# Patient Record
Sex: Male | Born: 1960 | Race: White | Hispanic: No | Smoking: Current every day smoker
Health system: Southern US, Community
[De-identification: ages and names within clinical notes are randomized; demographics above are authoritative.]

## PROBLEM LIST (undated history)

## (undated) DIAGNOSIS — N200 Calculus of kidney: Secondary | ICD-10-CM

## (undated) DIAGNOSIS — F410 Panic disorder [episodic paroxysmal anxiety] without agoraphobia: Secondary | ICD-10-CM

## (undated) HISTORY — PX: CHOLECYSTECTOMY: SHX55

---

## 2010-05-31 ENCOUNTER — Emergency Department (HOSPITAL_COMMUNITY)
Admission: EM | Admit: 2010-05-31 | Discharge: 2010-05-31 | Payer: Self-pay | Source: Home / Self Care | Admitting: Emergency Medicine

## 2012-08-15 ENCOUNTER — Emergency Department (HOSPITAL_BASED_OUTPATIENT_CLINIC_OR_DEPARTMENT_OTHER)
Admission: EM | Admit: 2012-08-15 | Discharge: 2012-08-15 | Disposition: A | Discharge status: Home / Self Care | Payer: Self-pay | Attending: Emergency Medicine | Admitting: Emergency Medicine

## 2012-08-15 ENCOUNTER — Encounter (HOSPITAL_BASED_OUTPATIENT_CLINIC_OR_DEPARTMENT_OTHER): Payer: Self-pay | Admitting: *Deleted

## 2012-08-15 ENCOUNTER — Emergency Department (HOSPITAL_BASED_OUTPATIENT_CLINIC_OR_DEPARTMENT_OTHER): Payer: Self-pay

## 2012-08-15 DIAGNOSIS — Z9089 Acquired absence of other organs: Secondary | ICD-10-CM | POA: Insufficient documentation

## 2012-08-15 DIAGNOSIS — N23 Unspecified renal colic: Secondary | ICD-10-CM | POA: Insufficient documentation

## 2012-08-15 DIAGNOSIS — F17200 Nicotine dependence, unspecified, uncomplicated: Secondary | ICD-10-CM | POA: Insufficient documentation

## 2012-08-15 LAB — URINE MICROSCOPIC-ADD ON

## 2012-08-15 LAB — URINALYSIS, ROUTINE W REFLEX MICROSCOPIC
Glucose, UA: NEGATIVE mg/dL
Ketones, ur: 15 mg/dL — AB
pH: 6.5 (ref 5.0–8.0)

## 2012-08-15 MED ORDER — OXYCODONE-ACETAMINOPHEN 5-325 MG PO TABS: 1.0000 | ORAL_TABLET | Freq: Four times a day (QID) | ORAL | Status: AC | PRN

## 2012-08-15 MED ORDER — KETOROLAC TROMETHAMINE 30 MG/ML IJ SOLN
30.0000 mg | Freq: Once | INTRAMUSCULAR | Status: AC
  Administered 2012-08-15: 30 mg via INTRAVENOUS
  Filled 2012-08-15: qty 1

## 2012-08-15 NOTE — ED Notes (Signed)
Seen at [facility]  last week had a ct scan and  Found 3mm kidney stone gave him meds and sent home got better until this morning at 0400 he woke up with severe flank pain again with nausea

## 2012-08-15 NOTE — ED Notes (Signed)
Patient transported to Ultrasound 

## 2012-08-15 NOTE — ED Provider Notes (Signed)
History     CSN: [id number]  Arrival date & time 08/15/12  1023   First MD Initiated Contact with Patient 08/15/12 1111      Chief Complaint  Patient presents with  . Flank Pain    (Consider location/radiation/quality/duration/timing/severity/associated sxs/prior treatment) HPI Pt reports he was recently diagnosed with left sided ureteral stone at [facility], given pain medications and strainer. He has had improved pain, but has not passed the stone, that he is aware of. He reports during the night his pain returns, severe aching L flank, radiating to L abdomen and groin. Increased urinary frequency but no fever.   History reviewed. No pertinent past medical history.  Past Surgical History  Procedure Date  . Cholecystectomy     History reviewed. No pertinent family history.  History  Substance Use Topics  . Smoking status: Current Every Day Smoker  . Smokeless tobacco: Not on file  . Alcohol Use: No      Review of Systems All other systems reviewed and are negative except as noted in HPI.   Allergies  Review of patient's allergies indicates no known allergies.  Home Medications  No current outpatient prescriptions on file.  BP 145/85  Pulse 75  Temp 97.9 F (36.6 C) (Oral)  Resp 18  Ht 6' (1.829 m)  Wt 185 lb (83.915 kg)  BMI 25.09 kg/m2  SpO2 100%  Physical Exam  Nursing note and vitals reviewed. Constitutional: He is oriented to person, place, and time. He appears well-developed and well-nourished.  HENT:  Head: Normocephalic and atraumatic.  Eyes: EOM are normal. Pupils are equal, round, and reactive to light.  Neck: Normal range of motion. Neck supple.  Cardiovascular: Normal rate, normal heart sounds and intact distal pulses.   Pulmonary/Chest: Effort normal and breath sounds normal.  Abdominal: Bowel sounds are normal. He exhibits no distension. There is no tenderness.  Musculoskeletal: Normal range of motion. He exhibits no edema  and no tenderness.  Neurological: He is alert and oriented to person, place, and time. He has normal strength. No cranial nerve deficit or sensory deficit.  Skin: Skin is warm and dry. No rash noted.  Psychiatric: He has a normal mood and affect.    ED Course  Procedures (including critical care time)  Labs Reviewed  URINALYSIS, ROUTINE W REFLEX MICROSCOPIC - Abnormal; Notable for the following:    Color, Urine AMBER (*)  BIOCHEMICALS MAY BE AFFECTED BY COLOR   APPearance CLOUDY (*)     Specific Gravity, Urine 1.036 (*)     Hgb urine dipstick LARGE (*)     Bilirubin Urine SMALL (*)     Ketones, ur 15 (*)     Protein, ur 100 (*)     Leukocytes, UA TRACE (*)     All other components within normal limits  URINE MICROSCOPIC-ADD ON - Abnormal; Notable for the following:    Crystals CA OXALATE CRYSTALS (*)     All other components within normal limits   US Renal  08/15/2012  *RADIOLOGY REPORT*  Clinical Data: Left lower quadrant left flank pain, recent diagnosis of left kidney stone  RENAL/URINARY TRACT ULTRASOUND COMPLETE  Comparison:  None  Findings:  Right Kidney:  11.6 cm length.  Normal cortical thickness echogenicity.  No mass, hydronephrosis or shadowing calcification.  Left Kidney:  12.1 cm length.  Normal cortical thickness echogenicity.  Mild left hydronephrosis.  No definite mass or shadowing calcification.  Bladder:  Decompressed, inadequately evaluated.  IMPRESSION: Mild left  hydronephrosis. No definite renal calculi identified, though the left ureter is not visualized on this study.   Original Report Authenticated By: Ulyses Southward, M.D.      No diagnosis found.    MDM  CT report from [facility] from 12/28 showed a distal 3mm stone on the left. Will check [place] to eval for continued hydronephrosis. Pain meds given (Toradol due to driving and low likelihood of emergent lithotripsy).   12:35 PM Improved pain, [place] consistent with continued stone. Advised to continue flomax, pain meds as  needed and Urology followup.       Charles B. Bernette Mayers, MD 08/15/12 1235

## 2012-08-30 ENCOUNTER — Emergency Department (HOSPITAL_BASED_OUTPATIENT_CLINIC_OR_DEPARTMENT_OTHER)
Admission: EM | Admit: 2012-08-30 | Discharge: 2012-08-30 | Disposition: A | Discharge status: Home / Self Care | Payer: Self-pay | Attending: Emergency Medicine | Admitting: Emergency Medicine

## 2012-08-30 ENCOUNTER — Emergency Department (HOSPITAL_BASED_OUTPATIENT_CLINIC_OR_DEPARTMENT_OTHER): Payer: Self-pay

## 2012-08-30 ENCOUNTER — Encounter (HOSPITAL_BASED_OUTPATIENT_CLINIC_OR_DEPARTMENT_OTHER): Payer: Self-pay

## 2012-08-30 DIAGNOSIS — S060X0A Concussion without loss of consciousness, initial encounter: Secondary | ICD-10-CM | POA: Insufficient documentation

## 2012-08-30 DIAGNOSIS — S139XXA Sprain of joints and ligaments of unspecified parts of neck, initial encounter: Secondary | ICD-10-CM | POA: Insufficient documentation

## 2012-08-30 DIAGNOSIS — W010XXA Fall on same level from slipping, tripping and stumbling without subsequent striking against object, initial encounter: Secondary | ICD-10-CM | POA: Insufficient documentation

## 2012-08-30 DIAGNOSIS — Y92009 Unspecified place in unspecified non-institutional (private) residence as the place of occurrence of the external cause: Secondary | ICD-10-CM | POA: Insufficient documentation

## 2012-08-30 DIAGNOSIS — F17200 Nicotine dependence, unspecified, uncomplicated: Secondary | ICD-10-CM | POA: Insufficient documentation

## 2012-08-30 DIAGNOSIS — Y9329 Activity, other involving ice and snow: Secondary | ICD-10-CM | POA: Insufficient documentation

## 2012-08-30 DIAGNOSIS — S060X9A Concussion with loss of consciousness of unspecified duration, initial encounter: Secondary | ICD-10-CM

## 2012-08-30 DIAGNOSIS — Z87442 Personal history of urinary calculi: Secondary | ICD-10-CM | POA: Insufficient documentation

## 2012-08-30 DIAGNOSIS — S161XXA Strain of muscle, fascia and tendon at neck level, initial encounter: Secondary | ICD-10-CM

## 2012-08-30 HISTORY — DX: Calculus of kidney: N20.0

## 2012-08-30 MED ORDER — CYCLOBENZAPRINE HCL 5 MG PO TABS: 5.0000 mg | ORAL_TABLET | Freq: Three times a day (TID) | ORAL | Status: AC | PRN

## 2012-08-30 MED ORDER — NAPROXEN 500 MG PO TABS: 500.0000 mg | ORAL_TABLET | Freq: Two times a day (BID) | ORAL | Status: AC

## 2012-08-30 NOTE — ED Provider Notes (Signed)
History    CSN: [id number] Arrival date & time 08/30/12  1102 First MD Initiated Contact with Patient 08/30/12 1140      Chief Complaint  Patient presents with  . Head Injury    Patient is a 52 y.o. male presenting with head injury. The history is provided by the patient.  Head Injury  The incident occurred 2 days ago. He came to the ER via walk-in. The injury mechanism was a fall. There was no loss of consciousness. The volume of blood lost was minimal. The quality of the pain is described as sharp. The pain is moderate. The pain has been intermittent since the injury. Pertinent negatives include no numbness, no vomiting, no tinnitus and no weakness.  Pt also has pain in his neck.  The neck pain started today.  He feels sore in his neck muscles.  He was initially screened by EMS when he fell the other day.  Past Medical History  Diagnosis Date  . Kidney stone     Past Surgical History  Procedure Date  . Cholecystectomy     No family history on file.  History  Substance Use Topics  . Smoking status: Current Every Day Smoker  . Smokeless tobacco: Not on file  . Alcohol Use: No      Review of Systems  HENT: Negative for tinnitus.   Gastrointestinal: Negative for vomiting.  Neurological: Negative for weakness and numbness.  All other systems reviewed and are negative.    Allergies  Review of patient's allergies indicates no known allergies.  Home Medications   Current Outpatient Rx  Name  Route  Sig  Dispense  Refill  . IBUPROFEN 200 MG PO TABS   Oral   Take 200 mg by mouth every 6 (six) hours as needed.         . OXYCODONE-ACETAMINOPHEN 5-325 MG PO TABS   Oral   Take 1-2 tablets by mouth every 6 (six) hours as needed for pain.   20 tablet   0     BP 141/88  Pulse 60  Temp 97.8 F (36.6 C) (Oral)  Resp 20  Ht 6' (1.829 m)  Wt 185 lb (83.915 kg)  BMI 25.09 kg/m2  SpO2 100%  Physical Exam  Nursing note and vitals reviewed. Constitutional: He  appears well-developed and well-nourished. No distress.  HENT:  Head: Normocephalic.  Right Ear: External ear normal.  Left Ear: External ear normal.       Abrasion posterior forehead   Eyes: Conjunctivae normal are normal. Right eye exhibits no discharge. Left eye exhibits no discharge. No scleral icterus.  Neck: Neck supple. No tracheal deviation present.  Cardiovascular: Normal rate, regular rhythm and intact distal pulses.   Pulmonary/Chest: Effort normal and breath sounds normal. No stridor. No respiratory distress. He has no wheezes. He has no rales.  Abdominal: Soft. Bowel sounds are normal. He exhibits no distension. There is no tenderness. There is no rebound and no guarding.  Musculoskeletal: He exhibits no edema and no tenderness.       Cervical back: He exhibits tenderness. He exhibits no bony tenderness, no swelling, no edema and no deformity.       Thoracic back: Normal.       Lumbar back: Normal.       Small , healing laceration right hand (5 mm)  Neurological: He is alert. He has normal strength. No sensory deficit. Cranial nerve deficit:  no gross defecits noted. He exhibits normal muscle tone. He displays  no seizure activity. Coordination normal.  Skin: Skin is warm and dry. No rash noted.  Psychiatric: He has a normal mood and affect.    ED Course  Procedures (including critical care time)  Labs Reviewed - No data to display Ct Head Wo Contrast  08/30/2012  *RADIOLOGY REPORT*  Clinical Data:  Larey Seat.  Hit back of head.  CT HEAD WITHOUT CONTRAST CT CERVICAL SPINE WITHOUT CONTRAST  Technique:  Multidetector CT imaging of the head and cervical spine was performed following the standard protocol without intravenous contrast.  Multiplanar CT image reconstructions of the cervical spine were also generated.  Comparison:   None  CT HEAD  Findings: The ventricles are normal.  No extra-axial fluid collections are seen.  The brainstem and cerebellum are unremarkable.  No acute  intracranial findings such as infarction or hemorrhage.  No mass lesions.  The bony calvarium is intact.  Scattered sinus disease is noted.  IMPRESSION: No acute intracranial findings or skull fracture.  CT CERVICAL SPINE  Findings: The sagittal reformatted images demonstrate normal alignment of the cervical vertebral bodies.  Disc spaces and vertebral bodies are maintained.  No acute bony findings or abnormal prevertebral soft tissue swelling. Sclerotic lesion noted in the C3 vertebral body on the left is likely a benign bone island.  The facets are normally aligned.  No facet or laminar fractures are seen. No large disc protrusions.  The neural foramen are patent.  The skull base C1 and C1-C2 articulations are maintained.  The dens is normal.  There are scattered cervical lymph nodes.  The lung apices are clear.  IMPRESSION: Normal alignment and no acute bony findings. Normal alignment and no acute fracture.   Original Report Authenticated By: Rudie Meyer, M.D.    Ct Cervical Spine Wo Contrast  08/30/2012  *RADIOLOGY REPORT*  Clinical Data:  Larey Seat.  Hit back of head.  CT HEAD WITHOUT CONTRAST CT CERVICAL SPINE WITHOUT CONTRAST  Technique:  Multidetector CT imaging of the head and cervical spine was performed following the standard protocol without intravenous contrast.  Multiplanar CT image reconstructions of the cervical spine were also generated.  Comparison:   None  CT HEAD  Findings: The ventricles are normal.  No extra-axial fluid collections are seen.  The brainstem and cerebellum are unremarkable.  No acute intracranial findings such as infarction or hemorrhage.  No mass lesions.  The bony calvarium is intact.  Scattered sinus disease is noted.  IMPRESSION: No acute intracranial findings or skull fracture.  CT CERVICAL SPINE  Findings: The sagittal reformatted images demonstrate normal alignment of the cervical vertebral bodies.  Disc spaces and vertebral bodies are maintained.  No acute bony findings or  abnormal prevertebral soft tissue swelling. Sclerotic lesion noted in the C3 vertebral body on the left is likely a benign bone island.  The facets are normally aligned.  No facet or laminar fractures are seen. No large disc protrusions.  The neural foramen are patent.  The skull base C1 and C1-C2 articulations are maintained.  The dens is normal.  There are scattered cervical lymph nodes.  The lung apices are clear.  IMPRESSION: Normal alignment and no acute bony findings. Normal alignment and no acute fracture.   Original Report Authenticated By: Rudie Meyer, M.D.      MDM  Patient does not have evidence of serious injury.  His headache and neck pain is likely related to soft tissue injury.        Celene Kras, MD 08/30/12 1254

## 2012-08-30 NOTE — ED Notes (Signed)
Pt states he slipped/fell on ice 2 days ago-struck the back of head-states he is unsure if LOC "i don't remember but if i did it wasn't for long"- -pt did get self up and into his home-EMS came to the home-pt did not seek further medical tx at that time-c/o cont'd HA and neck pain

## 2013-06-16 ENCOUNTER — Encounter (HOSPITAL_BASED_OUTPATIENT_CLINIC_OR_DEPARTMENT_OTHER): Payer: Self-pay | Admitting: Emergency Medicine

## 2013-06-16 ENCOUNTER — Emergency Department (HOSPITAL_BASED_OUTPATIENT_CLINIC_OR_DEPARTMENT_OTHER)
Admission: EM | Admit: 2013-06-16 | Discharge: 2013-06-16 | Disposition: A | Discharge status: Home / Self Care | Payer: Self-pay | Attending: Emergency Medicine | Admitting: Emergency Medicine

## 2013-06-16 DIAGNOSIS — F17200 Nicotine dependence, unspecified, uncomplicated: Secondary | ICD-10-CM | POA: Insufficient documentation

## 2013-06-16 DIAGNOSIS — Z79899 Other long term (current) drug therapy: Secondary | ICD-10-CM | POA: Insufficient documentation

## 2013-06-16 DIAGNOSIS — Z87442 Personal history of urinary calculi: Secondary | ICD-10-CM | POA: Insufficient documentation

## 2013-06-16 DIAGNOSIS — H5710 Ocular pain, unspecified eye: Secondary | ICD-10-CM | POA: Insufficient documentation

## 2013-06-16 DIAGNOSIS — H5789 Other specified disorders of eye and adnexa: Secondary | ICD-10-CM | POA: Insufficient documentation

## 2013-06-16 DIAGNOSIS — H5712 Ocular pain, left eye: Secondary | ICD-10-CM

## 2013-06-16 MED ORDER — ERYTHROMYCIN 5 MG/GM OP OINT: TOPICAL_OINTMENT | OPHTHALMIC | Status: AC

## 2013-06-16 MED ORDER — TETRACAINE HCL 0.5 % OP SOLN
2.0000 [drp] | Freq: Once | OPHTHALMIC | Status: AC
  Administered 2013-06-16: 2 [drp] via OPHTHALMIC

## 2013-06-16 MED ORDER — FLUORESCEIN SODIUM 1 MG OP STRP
1.0000 | ORAL_STRIP | Freq: Once | OPHTHALMIC | Status: AC
  Administered 2013-06-16: 1 via OPHTHALMIC

## 2013-06-16 NOTE — ED Notes (Signed)
Left eye pain x 1 hour. Using bleach and pressure washer. Unsure if he scratched his eye or got a FB in it.

## 2013-06-16 NOTE — ED Provider Notes (Signed)
CSN: [id number]     Arrival date & time 06/16/13  1706 History  This chart was scribed for Junius Argyle, MD by Blanchard Kelch, ED Scribe. The patient was seen in room MH08/MH08. Patient's care was started at 5:54 PM.    Chief Complaint  Patient presents with  . Eye Injury    Patient is a 52 y.o. male presenting with eye injury. The history is provided by the patient. No language interpreter was used.  Eye Injury This is a new problem. The current episode started 1 to 2 hours ago. The problem occurs constantly. The problem has been gradually improving. Exacerbated by: blinking. Relieved by: washing out with water.    HPI Comments: Bill Townsend is a 52 y.o. 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 He states that he suddenly felt foreign body get into his left eye about two hours ago while he was actively washing the house. He states it felt like a small pebble was stuck in his left eye. He was wearing reading glasses but no other protective eye gear. He is complaining of itching eye pain onset after the accident and feels like there is something still in the eye. He tried washing it out with a water bottle right after the injury occurred with mild relief. The pain has improved since the injury occurred. It was worsened when he blinked.  He denies any vision changes. He has had eye injuries in the past where he has gotten bleach in his eye. He has a past surgical history of cholecystectomy. He has a past medical history of a kidney stone.   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 He denies wearing contacts.     Past Medical History  Diagnosis Date  . Kidney stone    Past Surgical History  Procedure Laterality Date  . Cholecystectomy     No family history on file. History  Substance Use Topics  . Smoking  status: Current Every Day Smoker  . Smokeless tobacco: Not on file  . Alcohol Use: No    Review of Systems  Constitutional: Negative for fever.  HENT: Negative for drooling.   Eyes: Positive for pain, redness and itching. Negative for visual disturbance.  Respiratory: Negative for cough.   Cardiovascular: Negative for leg swelling.  Gastrointestinal: Negative for vomiting.  Endocrine: Negative for polyuria.  Genitourinary: Negative for hematuria.  Musculoskeletal: Negative for gait problem.  Skin: Negative for rash.  Allergic/Immunologic: Negative for immunocompromised state.  Neurological: Negative for speech difficulty.  Hematological: Negative for adenopathy.  Psychiatric/Behavioral: Negative for confusion.  All other systems reviewed and are negative.    Allergies  Review of patient's allergies indicates no known allergies.  Home Medications   Current Outpatient Rx  Name  Route  Sig  Dispense  Refill  . Diazepam (VALIUM PO)   Oral   Take by mouth.         . cyclobenzaprine (FLEXERIL) 5 MG tablet   Oral   Take 1 tablet (5 mg total) by mouth 3 (three) times daily as needed for muscle spasms.   21 tablet   0   . naproxen (NAPROSYN) 500 MG tablet   Oral   Take 1 tablet (500 mg total) by mouth 2 (two) times daily with a meal. As needed for pain   20 tablet  0   . oxyCODONE-acetaminophen (PERCOCET/ROXICET) 5-325 MG per tablet   Oral   Take 1-2 tablets by mouth every 6 (six) hours as needed for pain.   20 tablet   0    Triage Vitals: BP 126/80  Pulse 88  Temp(Src) 98.8 F (37.1 C) (Oral)  Resp 20  Wt 185 lb (83.915 kg)  SpO2 99%  Physical Exam  Nursing note and vitals reviewed. Constitutional: He is oriented to person, place, and time. He appears well-developed and well-nourished. No distress.  HENT:  Head: Normocephalic and atraumatic.  Mouth/Throat: Oropharynx is clear and moist.  Eyes: EOM are normal. Pupils are equal, round, and reactive to  light. No foreign body present in the left eye. Left conjunctiva is injected.  No obvious abrasion upon staining of the left eye.   Normal pH of left eye upon testing with pH strip.   Left eye has mild to moderate conjunctival injection.  20/70 vision bilaterally, tested w/out pt's reading glasses.   Eye lid everted w/out evidence of foreign body.   Neck: Neck supple. No tracheal deviation present.  Cardiovascular: Normal rate.   Pulmonary/Chest: Effort normal. No respiratory distress.  Musculoskeletal: Normal range of motion.  Neurological: He is alert and oriented to person, place, and time.  Skin: Skin is warm and dry.  Psychiatric: He has a normal mood and affect. His behavior is normal.    ED Course  Procedures (including critical care time)  DIAGNOSTIC STUDIES: Oxygen Saturation is 99% on room air, normal by my interpretation.    COORDINATION OF CARE: 5:59 PM -Will order antibiotic ointment for left eye due to suspicion of small unseen abrasion to left eye. Patient verbalizes understanding and agrees with treatment plan.    Labs Review Labs Reviewed - No data to display Imaging Review No results found.  EKG Interpretation   None       MDM   1. Eye pain, left    3:56 PM 52 y.o. male who pw fb in eye sensation. Pt was pressure washing and believes a pebble hit him in the left eye suddenly. AFVSS here. No obvious abrasion w/ staining. Pt was using bleach water, denies getting it in his eye, but I will check pH anyway. Will tx empirically for abrasion as his sx c/w this. Will give tetracaine w/ specific instructions to throw away the bottle after 24 hrs, he understands.     I have discussed the diagnosis/risks/treatment options with the patient and believe the pt to be eligible for discharge home to follow-up with pcp as needed. We also discussed returning to the ED immediately if new or worsening sx occur. We discussed the sx which are most concerning (e.g.,  worsening pain, change in vision, eye drainage) that necessitate immediate return. Any new prescriptions provided to the patient are listed below.  Discharge Medication List as of 06/16/2013  6:17 PM    START taking these medications   Details  erythromycin ophthalmic ointment Place a 1/2 inch ribbon of ointment into the lower eyelid. Apply to affected eye every 6 hours daily for 7 days., Print          I personally performed the services described in this documentation, which was scribed in my presence. The recorded information has been reviewed and is accurate.    Junius Argyle, MD 06/17/13 1600

## 2013-08-08 ENCOUNTER — Encounter (HOSPITAL_BASED_OUTPATIENT_CLINIC_OR_DEPARTMENT_OTHER): Payer: Self-pay | Admitting: Emergency Medicine

## 2013-08-08 ENCOUNTER — Emergency Department (HOSPITAL_BASED_OUTPATIENT_CLINIC_OR_DEPARTMENT_OTHER)
Admission: EM | Admit: 2013-08-08 | Discharge: 2013-08-08 | Disposition: A | Discharge status: Home / Self Care | Payer: Self-pay | Attending: Emergency Medicine | Admitting: Emergency Medicine

## 2013-08-08 ENCOUNTER — Emergency Department (HOSPITAL_BASED_OUTPATIENT_CLINIC_OR_DEPARTMENT_OTHER): Payer: Self-pay

## 2013-08-08 DIAGNOSIS — Z87442 Personal history of urinary calculi: Secondary | ICD-10-CM | POA: Insufficient documentation

## 2013-08-08 DIAGNOSIS — F410 Panic disorder [episodic paroxysmal anxiety] without agoraphobia: Secondary | ICD-10-CM | POA: Insufficient documentation

## 2013-08-08 DIAGNOSIS — J111 Influenza due to unidentified influenza virus with other respiratory manifestations: Secondary | ICD-10-CM | POA: Insufficient documentation

## 2013-08-08 DIAGNOSIS — R69 Illness, unspecified: Secondary | ICD-10-CM

## 2013-08-08 DIAGNOSIS — M2550 Pain in unspecified joint: Secondary | ICD-10-CM | POA: Insufficient documentation

## 2013-08-08 DIAGNOSIS — F17200 Nicotine dependence, unspecified, uncomplicated: Secondary | ICD-10-CM | POA: Insufficient documentation

## 2013-08-08 HISTORY — DX: Panic disorder (episodic paroxysmal anxiety): F41.0

## 2013-08-08 MED ORDER — GUAIFENESIN-CODEINE 100-10 MG/5ML PO SOLN: 10.0000 mL | Freq: Four times a day (QID) | ORAL | Status: AC | PRN

## 2013-08-08 NOTE — ED Notes (Signed)
Cold and cough symptoms with body aches and productive cough for the last two days.

## 2013-08-08 NOTE — ED Provider Notes (Signed)
CSN: [id number]     Arrival date & time 08/08/13  1052 History   First MD Initiated Contact with Patient 08/08/13 1123     Chief Complaint  Patient presents with  . URI   (Consider location/radiation/quality/duration/timing/severity/associated sxs/prior Treatment) Patient is a 53 y.o. male presenting with URI. The history is provided by the patient.  URI Presenting symptoms: congestion, cough, fever and sore throat   Severity:  Moderate Onset quality:  Gradual Duration:  2 days Timing:  Constant Progression:  Worsening Chronicity:  New Relieved by:  Nothing Worsened by:  Nothing tried Ineffective treatments:  None tried Associated symptoms: arthralgias     Past Medical History  Diagnosis Date  . Kidney stone   . Panic anxiety syndrome    Past Surgical History  Procedure Laterality Date  . Cholecystectomy     No family history on file. History  Substance Use Topics  . Smoking status: Current Every Day Smoker  . Smokeless tobacco: Not on file  . Alcohol Use: No    Review of Systems  Constitutional: Positive for fever.  HENT: Positive for congestion and sore throat.   Respiratory: Positive for cough.   Musculoskeletal: Positive for arthralgias.  All other systems reviewed and are negative.    Allergies  Review of patient's allergies indicates no known allergies.  Home Medications   Current Outpatient Rx  Name  Route  Sig  Dispense  Refill  . cyclobenzaprine (FLEXERIL) 5 MG tablet   Oral   Take 1 tablet (5 mg total) by mouth 3 (three) times daily as needed for muscle spasms.   21 tablet   0   . Diazepam (VALIUM PO)   Oral   Take by mouth.         . erythromycin ophthalmic ointment      Place a 1/2 inch ribbon of ointment into the lower eyelid. Apply to affected eye every 6 hours daily for 7 days.   1 g   0   . naproxen (NAPROSYN) 500 MG tablet   Oral   Take 1 tablet (500 mg total) by mouth 2 (two) times daily with a meal. As needed for pain   20  tablet   0   . oxyCODONE-acetaminophen (PERCOCET/ROXICET) 5-325 MG per tablet   Oral   Take 1-2 tablets by mouth every 6 (six) hours as needed for pain.   20 tablet   0    BP 135/84  Temp(Src) 98.8 F (37.1 C) (Oral)  Resp 20  Ht 6' (1.829 m)  Wt 185 lb (83.915 kg)  BMI 25.08 kg/m2  SpO2 99% Physical Exam  Nursing note and vitals reviewed. Constitutional: He is oriented to person, place, and time. He appears well-developed and well-nourished. No distress.  HENT:  Head: Normocephalic and atraumatic.  Mouth/Throat: Oropharynx is clear and moist.  TMs are clear bilaterally.  Neck: Normal range of motion. Neck supple.  Cardiovascular: Normal rate, regular rhythm and normal heart sounds.   No murmur heard. Pulmonary/Chest: Effort normal and breath sounds normal. No respiratory distress. He has no wheezes.  Abdominal: Soft. Bowel sounds are normal. He exhibits no distension. There is no tenderness.  Musculoskeletal: Normal range of motion. He exhibits no edema.  Lymphadenopathy:    He has no cervical adenopathy.  Neurological: He is alert and oriented to person, place, and time.  Skin: Skin is warm and dry. He is not diaphoretic.    ED Course  Procedures (including critical care time) Labs Review Labs  Reviewed - No data to display Imaging Review No results found.    MDM  No diagnosis found. Patient is a 53 year old male who presents with influenza-like symptoms. Chest x-ray is negative for pneumonia. I suspect a viral etiology. He will be treated with cough syrup, ibuprofen, Motrin, plenty of fluids and when necessary followup with his symptoms worsen or change.    Geoffery Lyons, MD 08/08/13 [phone]

## 2013-08-08 NOTE — Discharge Instructions (Signed)
Robitussin with codeine as needed for cough.  Tylenol 1000 mg rotated with Motrin 600 mg every 4 hours as needed for fever.  Drink plenty of fluids get plenty of rest.  Return to the emergency department if you develop severe chest pain, difficulty breathing, or other new or concerning symptoms.   Influenza, Adult Influenza ("the flu") is a viral infection of the respiratory tract. It occurs more often in winter months because people spend more time in close contact with one another. Influenza can make you feel very sick. Influenza easily spreads from person to person (contagious). CAUSES  Influenza is caused by a virus that infects the respiratory tract. You can catch the virus by breathing in droplets from an infected person's cough or sneeze. You can also catch the virus by touching something that was recently contaminated with the virus and then touching your mouth, nose, or eyes. SYMPTOMS  Symptoms typically last 4 to 10 days and may include:  Fever.  Chills.  Headache, body aches, and muscle aches.  Sore throat.  Chest discomfort and cough.  Poor appetite.  Weakness or feeling tired.  Dizziness.  Nausea or vomiting. DIAGNOSIS  Diagnosis of influenza is often made based on your history and a physical exam. A nose or throat swab test can be done to confirm the diagnosis. RISKS AND COMPLICATIONS You may be at risk for a more severe case of influenza if you smoke cigarettes, have diabetes, have chronic heart disease (such as heart failure) or lung disease (such as asthma), or if you have a weakened immune system. Elderly people and pregnant women are also at risk for more serious infections. The most common complication of influenza is a lung infection (pneumonia). Sometimes, this complication can require emergency medical care and may be life-threatening. PREVENTION  An annual influenza vaccination (flu shot) is the best way to avoid getting influenza. An annual flu shot is  now routinely recommended for all adults in the [place] TREATMENT  In mild cases, influenza goes away on its own. Treatment is directed at relieving symptoms. For more severe cases, your caregiver may prescribe antiviral medicines to shorten the sickness. Antibiotic medicines are not effective, because the infection is caused by a virus, not by bacteria. HOME CARE INSTRUCTIONS  Only take over-the-counter or prescription medicines for pain, discomfort, or fever as directed by your caregiver.  Use a cool mist humidifier to make breathing easier.  Get plenty of rest until your temperature returns to normal. This usually takes 3 to 4 days.  Drink enough fluids to keep your urine clear or pale yellow.  Cover your mouth and nose when coughing or sneezing, and wash your hands well to avoid spreading the virus.  Stay home from work or school until your fever has been gone for at least 1 full day. SEEK MEDICAL CARE IF:   You have chest pain or a deep cough that worsens or produces more mucus.  You have nausea, vomiting, or diarrhea. SEEK IMMEDIATE MEDICAL CARE IF:   You have difficulty breathing, shortness of breath, or your skin or nails turn bluish.  You have severe neck pain or stiffness.  You have a severe headache, facial pain, or earache.  You have a worsening or recurring fever.  You have nausea or vomiting that cannot be controlled. MAKE SURE YOU:  Understand these instructions.  Will watch your condition.  Will get help right away if you are not doing well or get worse. Document Released: 07/21/2000 Document Revised: 01/23/2012  Document Reviewed: 10/23/2011 [facility] Patient Information 2014 [place], [place].

## 2014-05-11 ENCOUNTER — Encounter (HOSPITAL_BASED_OUTPATIENT_CLINIC_OR_DEPARTMENT_OTHER): Payer: Self-pay | Admitting: Emergency Medicine

## 2014-05-11 ENCOUNTER — Emergency Department (HOSPITAL_BASED_OUTPATIENT_CLINIC_OR_DEPARTMENT_OTHER)
Admission: EM | Admit: 2014-05-11 | Discharge: 2014-05-11 | Disposition: A | Discharge status: Home / Self Care | Payer: Self-pay | Attending: Emergency Medicine | Admitting: Emergency Medicine

## 2014-05-11 ENCOUNTER — Emergency Department (HOSPITAL_BASED_OUTPATIENT_CLINIC_OR_DEPARTMENT_OTHER): Payer: Self-pay

## 2014-05-11 DIAGNOSIS — F410 Panic disorder [episodic paroxysmal anxiety] without agoraphobia: Secondary | ICD-10-CM | POA: Insufficient documentation

## 2014-05-11 DIAGNOSIS — J029 Acute pharyngitis, unspecified: Secondary | ICD-10-CM | POA: Insufficient documentation

## 2014-05-11 DIAGNOSIS — Z87442 Personal history of urinary calculi: Secondary | ICD-10-CM | POA: Insufficient documentation

## 2014-05-11 DIAGNOSIS — Z720 Tobacco use: Secondary | ICD-10-CM | POA: Insufficient documentation

## 2014-05-11 DIAGNOSIS — Z79899 Other long term (current) drug therapy: Secondary | ICD-10-CM | POA: Insufficient documentation

## 2014-05-11 DIAGNOSIS — Z791 Long term (current) use of non-steroidal anti-inflammatories (NSAID): Secondary | ICD-10-CM | POA: Insufficient documentation

## 2014-05-11 LAB — RAPID STREP SCREEN (MED CTR MEBANE ONLY): Streptococcus, Group A Screen (Direct): NEGATIVE

## 2014-05-11 MED ORDER — IBUPROFEN 800 MG PO TABS: 800.0000 mg | ORAL_TABLET | Freq: Three times a day (TID) | ORAL | Status: AC

## 2014-05-11 MED ORDER — AMOXICILLIN 500 MG PO CAPS: 500.0000 mg | ORAL_CAPSULE | Freq: Three times a day (TID) | ORAL | Status: AC

## 2014-05-11 MED ORDER — HYDROCODONE-ACETAMINOPHEN 5-325 MG PO TABS: 2.0000 | ORAL_TABLET | ORAL | Status: AC | PRN

## 2014-05-11 NOTE — ED Provider Notes (Signed)
History/physical exam/procedure(s) were performed by non-physician practitioner and as supervising physician I was immediately available for consultation/collaboration. I have reviewed all notes and am in agreement with care and plan.   Hilario Quarryanielle S Modell Fendrick, MD 05/11/14 [phone]

## 2014-05-11 NOTE — ED Provider Notes (Signed)
CSN: [id number]     Arrival date & time 05/11/14  [id number] History   First MD Initiated Contact with Patient 05/11/14 [phone]     Chief Complaint  Patient presents with  . Fever     (Consider location/radiation/quality/duration/timing/severity/associated sxs/prior Treatment) Patient is a 53 y.o. male presenting with cough. The history is provided by the patient. No language interpreter was used.  Cough Cough characteristics:  Productive Sputum characteristics:  Nondescript Severity:  Moderate Onset quality:  Gradual Timing:  Constant Progression:  Worsening Chronicity:  New Smoker: no   Relieved by:  Nothing Worsened by:  Nothing tried Ineffective treatments:  None tried Associated symptoms: shortness of breath   Associated symptoms: no chest pain   Risk factors: no recent infection     Past Medical History  Diagnosis Date  . Kidney stone   . Panic anxiety syndrome    Past Surgical History  Procedure Laterality Date  . Cholecystectomy     No family history on file. History  Substance Use Topics  . Smoking status: Current Every Day Smoker  . Smokeless tobacco: Not on file  . Alcohol Use: No    Review of Systems  Respiratory: Positive for cough and shortness of breath.   Cardiovascular: Negative for chest pain.  All other systems reviewed and are negative.     Allergies  Review of patient's allergies indicates no known allergies.  Home Medications   Prior to Admission medications   Medication Sig Start Date End Date Taking? Authorizing Provider  cyclobenzaprine (FLEXERIL) 5 MG tablet Take 1 tablet (5 mg total) by mouth 3 (three) times daily as needed for muscle spasms. 08/30/12   Linwood DibblesJon Knapp, MD  Diazepam (VALIUM PO) Take by mouth.    Historical Provider, MD  erythromycin ophthalmic ointment Place a 1/2 inch ribbon of ointment into the lower eyelid. Apply to affected eye every 6 hours daily for 7 days. 06/16/13   Purvis SheffieldForrest Harrison, MD  guaiFENesin-codeine 100-10  MG/5ML syrup Take 10 mLs by mouth every 6 (six) hours as needed for cough. 08/08/13   Geoffery Lyonsouglas Delo, MD  naproxen (NAPROSYN) 500 MG tablet Take 1 tablet (500 mg total) by mouth 2 (two) times daily with a meal. As needed for pain 08/30/12   Linwood DibblesJon Knapp, MD  oxyCODONE-acetaminophen (PERCOCET/ROXICET) 5-325 MG per tablet Take 1-2 tablets by mouth every 6 (six) hours as needed for pain. 08/15/12   Charles B. Bernette MayersSheldon, MD   There were no vitals taken for this visit. Physical Exam  Nursing note and vitals reviewed. Constitutional: He appears well-developed and well-nourished.  HENT:  Head: Normocephalic.  Right Ear: External ear normal.  Left Ear: External ear normal.  Eyes: Conjunctivae are normal. Pupils are equal, round, and reactive to light.  Neck: Normal range of motion. Neck supple.  Cardiovascular: Normal rate.   Pulmonary/Chest: Effort normal and breath sounds normal.  rhonchi  Abdominal: Soft.  Musculoskeletal: Normal range of motion.  Neurological: He is alert.  Skin: Skin is warm.  Psychiatric: He has a normal mood and affect.    ED Course  Procedures (including critical care time) Labs Review Labs Reviewed - No data to display  Imaging Review Dg Chest 2 View  05/11/2014   CLINICAL DATA:  Cough, fever and congestion for a few days.  EXAM: CHEST  2 VIEW  COMPARISON:  08/08/2013 and CT cervical spine 08/30/2012.  FINDINGS: There is a slight impression on the right lateral aspect of the trachea, unchanged. Trachea is otherwise midline. Heart  size normal. Lungs are clear. No pleural fluid.  IMPRESSION: 1. No acute findings. 2. Slight impression on the right lateral aspect of the trachea corresponds to an enlarged right lobe of the thyroid, which contains low-attenuation lesions on 08/30/2012. Consider nonemergent thyroid ultrasound in further evaluation, as clinically indicated.   Electronically Signed   By: Leanna Battles M.D.   On: 05/11/2014 10:28     EKG Interpretation None       MDM  Results for orders placed during the hospital encounter of 05/11/14  RAPID STREP SCREEN      Result Value Ref Range   Streptococcus, Group A Screen (Direct) NEGATIVE  NEGATIVE   Dg Chest 2 View  05/11/2014   CLINICAL DATA:  Cough, fever and congestion for a few days.  EXAM: CHEST  2 VIEW  COMPARISON:  08/08/2013 and CT cervical spine 08/30/2012.  FINDINGS: There is a slight impression on the right lateral aspect of the trachea, unchanged. Trachea is otherwise midline. Heart size normal. Lungs are clear. No pleural fluid.  IMPRESSION: 1. No acute findings. 2. Slight impression on the right lateral aspect of the trachea corresponds to an enlarged right lobe of the thyroid, which contains low-attenuation lesions on 08/30/2012. Consider nonemergent thyroid ultrasound in further evaluation, as clinically indicated.   Electronically Signed   By: Leanna Battles M.D.   On: 05/11/2014 10:28      Final diagnoses:  Pharyngitis    Pt counseled on need for ultrasound of neck.   Pt given resource referral for evaluation and followup. Rx for amoxicillian, hydrocodone and ibuprofen    Elson Areas, [place] 05/11/14 1118

## 2014-05-11 NOTE — ED Notes (Signed)
rx x 3 given for ibuprofen, hydrocodone, amoxicillin- resource guide given for f/u

## 2014-05-11 NOTE — Discharge Instructions (Signed)
Pharyngitis °Pharyngitis is redness, pain, and swelling (inflammation) of your pharynx.  °CAUSES  °Pharyngitis is usually caused by infection. Most of the time, these infections are from viruses (viral) and are part of a cold. However, sometimes pharyngitis is caused by bacteria (bacterial). Pharyngitis can also be caused by allergies. Viral pharyngitis may be spread from person to person by coughing, sneezing, and personal items or utensils (cups, forks, spoons, toothbrushes). Bacterial pharyngitis may be spread from person to person by more intimate contact, such as kissing.  °SIGNS AND SYMPTOMS  °Symptoms of pharyngitis include:   °· Sore throat.   °· Tiredness (fatigue).   °· Low-grade fever.   °· Headache. °· Joint pain and muscle aches. °· Skin rashes. °· Swollen lymph nodes. °· Plaque-like film on throat or tonsils (often seen with bacterial pharyngitis). °DIAGNOSIS  °Your health care provider will ask you questions about your illness and your symptoms. Your medical history, along with a physical exam, is often all that is needed to diagnose pharyngitis. Sometimes, a rapid strep test is done. Other lab tests may also be done, depending on the suspected cause.  °TREATMENT  °Viral pharyngitis will usually get better in 3-4 days without the use of medicine. Bacterial pharyngitis is treated with medicines that kill germs (antibiotics).  °HOME CARE INSTRUCTIONS  °· Drink enough water and fluids to keep your urine clear or pale yellow.   °· Only take over-the-counter or prescription medicines as directed by your health care provider:   °¨ If you are prescribed antibiotics, make sure you finish them even if you start to feel better.   °¨ Do not take aspirin.   °· Get lots of rest.   °· Gargle with 8 oz of salt water (½ tsp of salt per 1 qt of water) as often as every 1-2 hours to soothe your throat.   °· Throat lozenges (if you are not at risk for choking) or sprays may be used to soothe your throat. °SEEK MEDICAL  CARE IF:  °· You have large, tender lumps in your neck. °· You have a rash. °· You cough up green, yellow-brown, or bloody spit. °SEEK IMMEDIATE MEDICAL CARE IF:  °· Your neck becomes stiff. °· You drool or are unable to swallow liquids. °· You vomit or are unable to keep medicines or liquids down. °· You have severe pain that does not go away with the use of recommended medicines. °· You have trouble breathing (not caused by a stuffy nose). °MAKE SURE YOU:  °· Understand these instructions. °· Will watch your condition. °· Will get help right away if you are not doing well or get worse. °Document Released: 07/24/2005 Document Revised: 05/14/2013 Document Reviewed: 03/31/2013 °ExitCare® Patient Information ©2015 [employer]. This information is not intended to replace advice given to you by your health care provider. Make sure you discuss any questions you have with your health care provider. ° ° °Emergency Department Resource Guide °1) Find a Doctor and Pay Out of Pocket °Although you won't have to find out who is covered by your insurance plan, it is a good idea to ask around and get recommendations. You will then need to call the office and see if the doctor you have chosen will accept you as a new patient and what types of options they offer for patients who are self-pay. Some doctors offer discounts or will set up payment plans for their patients who do not have insurance, but you will need to ask so you aren't surprised when you get to your appointment. ° °  2) Contact Your Local Health Department °Not all health departments have doctors that can see patients for sick visits, but many do, so it is worth a call to see if yours does. If you don't know where your local health department is, you can check in your phone book. The CDC also has a tool to help you locate your state's health department, and many state websites also have listings of all of their local health departments. ° °3) Find a Walk-in Clinic °If  your illness is not likely to be very severe or complicated, you may want to try a walk in clinic. These are popping up all over the country in pharmacies, drugstores, and shopping centers. They're usually staffed by nurse practitioners or physician assistants that have been trained to treat common illnesses and complaints. They're usually fairly quick and inexpensive. However, if you have serious medical issues or chronic medical problems, these are probably not your best option. ° °No Primary Care Doctor: °- Call [facility] at  832-8000 - they can help you locate a primary care doctor that  accepts your insurance, provides certain services, etc. °- Physician Referral Service- 1-800-533-3463 ° °Chronic Pain Problems: °Organization         Address  Phone   Notes  °[facility]  (336) 297-2271 Patients need to be referred by their primary care doctor.  ° °Medication Assistance: °Organization         Address  Phone   Notes  °[place] [facility] [address] °[place], [place] [address] (336) 641-8030 --Must be a resident of [place] °-- Must have NO insurance coverage whatsoever (no Medicaid/ Medicare, etc.) °-- The pt. MUST have a primary care doctor that directs their care regularly and follows them in the community °  °[facility]  (866) 331-1348   °[employer]  (888) 892-1162   ° °Agencies that provide inexpensive medical care: °Organization         Address  Phone   Notes  °[facility]  (336) 832-8035   ° Internal Medicine    (336) 832-7272   °[facility] [address] °[place], [place] [address] (336) 832-4777   °[facility] [address], °[place] (336) 271-4999   °[facility]    (336) 373-0678   °[facility]    (336) 272-1050   °[facility] ° [address], [place] Phone:  (336) 832-4444, Fax:  (336) 832-4440 Hours of  Operation:  9 am - 6 pm, M-F.  Also accepts Medicaid/Medicare and self-pay.  °[facility] ° [address], [place] Phone: (336) 832-3150, Fax: (336) [phone]. Hours of Operation:  8:30 am - 5:30 pm, M-F.  Also accepts Medicaid and self-pay.  °[facility] [place] [address], [place] Phone: (336) 878-6027   °[facility] [address], [place], [place] (336)723-1848, Ext. 123 Mondays & Thursdays: 7-9 AM.  First 15 patients are seen on a first come, first serve basis. °  ° °Medicaid-accepting [place] Providers: ° °Organization         Address  Phone   Notes  °[facility] [address], [place] (336) [phone] Also accepts self-pay patients.  °[facility] [address], [place] ° (336) 856-9996   °[facility] [address], [place] (336) 288-8857   °[facility] [address] [place] [address], [place] (336) 299-7000   °Veita Bland [address], [place]  ° (336) 373-1557 Only accepts [place] Access Medicaid patients after they have their name applied to their card.  ° °Self-Pay (no insurance) in [place]: ° °Organization         Address  Phone   Notes  °Sickle Cell Patients, [facility] [address], [place] (336) 832-1970   °[facility] [address], [place] (336) 832-4400   °[facility] ° [address], [place] (336) 992-4800   °[facility]/Dr. Osei-Bonsu ° [address] [place] [address], [place] or [address], [place] (336) 841-8500 Phone number for both [place] and [place] locations is the same.  °[facility] [address], [place] (336) 299-0000   °[facility] [address] [place] [address], [place] or [address] (336) 852-7530 °(336) 878-2260   °[facility] [address], [place] (336) 350-1642, phone; (336) 294-5005, fax Sees patients 1st and 3rd Saturday of every month.  Must not qualify for public or private insurance (i.e. Medicaid, Medicare, [place] Health Choice, Veterans' Benefits) • Household income should be no more than 200% of the poverty level •The clinic cannot treat you if you are pregnant or think you are pregnant • Sexually transmitted diseases are not treated at the clinic.  ° ° °Dental Care: °Organization         Address  Phone  Notes  °[facility] [address], [place] (336) 641-6152 Accepts children up to age 21 who are enrolled in Medicaid or [place] Health Choice; pregnant women with a Medicaid card; and children who have applied for Medicaid or [place] Health Choice, but were declined, whose parents can pay a reduced fee at time of service.  °[facility] [place]  [address], [place] (336) 641-7733 Accepts children up to age 21 who are enrolled in Medicaid or [place] Health Choice; pregnant women with a Medicaid card; and children who have applied for Medicaid or [place] Health Choice, but were declined, whose parents can pay a reduced fee at time of service.  °[facility] ° [address], [place] (336) 641-4533 Patients are seen by appointment only. Walk-ins are not accepted. [facility] will see patients 18 years of age and older. °Monday - Tuesday (8am-5pm) °Most Wednesdays (8:30-5pm) °$30 per visit, cash only  °[facility] ° [address], [place] (336) 641-4533 Patients are seen by appointment only. Walk-ins are not accepted. [facility] will see patients 18 years of age and older. °One Wednesday Evening (Monthly: Volunteer Based).  $30 per visit, cash only  °[facility]  (919) 537-3737 for adults; Children under age 4, call Graduate Pediatric  Dentistry at ([phone]) [phone]. Children aged 4-14, please call ([phone]) [phone] to request a pediatric application. ° Dental services are provided in all areas of dental care including fillings, crowns and bridges, complete and partial dentures, implants, gum treatment, root canals, and extractions. Preventive care is also provided. Treatment is provided to both adults and children. °Patients are selected via a lottery and there is often a waiting list. °  °[facility] [address], °  [place] ° (336) 763-8833 [email or web address] °  °[facility] [address], [place], [place] (336)723-1848, Ext. 123 Second and Fourth Thursday of each month, opens at 6:30 AM; Clinic ends at 9 AM.  Patients are seen on a first-come first-served basis, and a limited number are seen during each clinic.  ° °[facility] ° [address], [place], [place] (336) 723-7904   Eligibility Requirements °You must have lived in [place], [place], or [place] counties for at least the last three months. °  You cannot be eligible for state or federal sponsored healthcare insurance, including Veterans Administration, Medicaid, or Medicare. °  You generally cannot be eligible for healthcare insurance through your employer.  °  How to apply: °Eligibility screenings are held every Tuesday and Wednesday afternoon from 1:00 pm until 4:00 pm. You do not need an appointment for the interview!  °[facility] [address], [place], [place] 336-631-2330   °[facility]  336-342-8273   °[facility]  336-703-3100   °[facility]  336-570-6415   ° °Behavioral Health Resources in the Community: °Intensive Outpatient Programs °Organization         Address  Phone  Notes  °[facility] [address], [place], [place] 336-878-6098   °[facility] [address], [place], [place] 336-832-9800   °[facility] [address], [place], [place] ° 336-882-2125   °[facility] [address],  °[place], [place] 1-800-853-5163 or 336-641-4981   °Substance Abuse Resources °Organization         Address  Phone  Notes  °[facility]  336-882-2125   °[facility]  336-784-9470   °[facility]  336-285-9073   °[facility]  336-845-3988   °Residential & Outpatient Substance Abuse Program  1-800-659-3381   °Psychological Services °Organization         Address  Phone  Notes  °[facility]  336- 832-9600   °[facility]  336- 378-7881   °[facility] [address], [place] 1-800-853-5163 or 336-641-4981   ° °Mobile Crisis Teams °Organization         Address  Phone  Notes  °[facility], Mobile Crisis Care Unit  1-877-626-1772   °Assertive °[facility] ° [address] [place], [place] 336-834-9664   °Sharon DeEsch [address] °[place] 336-554-5454   ° °Self-Help/Support Groups °Organization         Address  Phone             Notes  °[facility] - variety of support groups  336- 373-1402 Call for more information  °Narcotics Anonymous (NA), [facility] [address], °[place]  2 meetings at this location  ° °Residential Treatment Programs °Organization         Address  Phone  Notes  °[facility] [address],    °[place]  1-866-801-8205   °[facility] ° [address], [place], [place] 704-293-8524   °[facility] [address], [place] 336-845-3988 Admissions: 8am-3pm M-F  °[facility] [address]    °[place], [place] 336-841-1104   °[facility] [address], [place], [place] 336-379-7146   °[facility] [address]  °[place], [place] 336-285-9073   °[facility] - Intensive Outpatient [address],  [place], [place] 336-852-3033   °[facility] [address]  °[place], [place] 1-877-615-2722 or 336-784-9470   °[facility] [address] [place], [place] 336-227-7417 Accepts Medicaid  °[facility] [address]  °[place] 1-800-659-3381 Substance Abuse/Addiction Treatment  ° °[place] Behavioral Health Resources °Organization         Address  Phone  Notes  °[facility]  (888) 581-9988   °Julie Brannon, PhD [address] [place], [place]   (336) 349-5553 or (336) 951-0000   °[facility]   [address] °[place], [place] (336) 349-4454   °[facility] [address], [place], [place] (336) 342-8316 Insurance/Medicaid/sponsorship through [facility]  °[facility] [address]                                    [place], [place] (336) 342-8316 Therapy/tele-psych/case  °[facility] [address]  ° [place], [place] (336) 349-2233    °Dr. Arfeen  (336) 349-4544   °[facility] [place]  [employer] [place] [facility] 1) [address], [place] °2) [address], [place] °3)  [address], [place] (336) 349-3220 °(336) 342-7768 ° °(336) 342-8140   °[place] [facility] (336) 342-1394 or (336) 342-3537 (After Hours)    ° ° ° ° °

## 2014-05-11 NOTE — ED Notes (Signed)
Pt reports fever x cough since yesterday, body aches, headache- took tylenol 1 hour pta

## 2014-05-13 LAB — CULTURE, GROUP A STREP

## 2014-10-07 IMAGING — CR DG CHEST 2V
2 series · 2 of 2 positions shown · non-contrast
Comparison: None

CLINICAL DATA: Cold and cough with body aches.

EXAM:
CHEST  2 VIEW

[w chest pa]
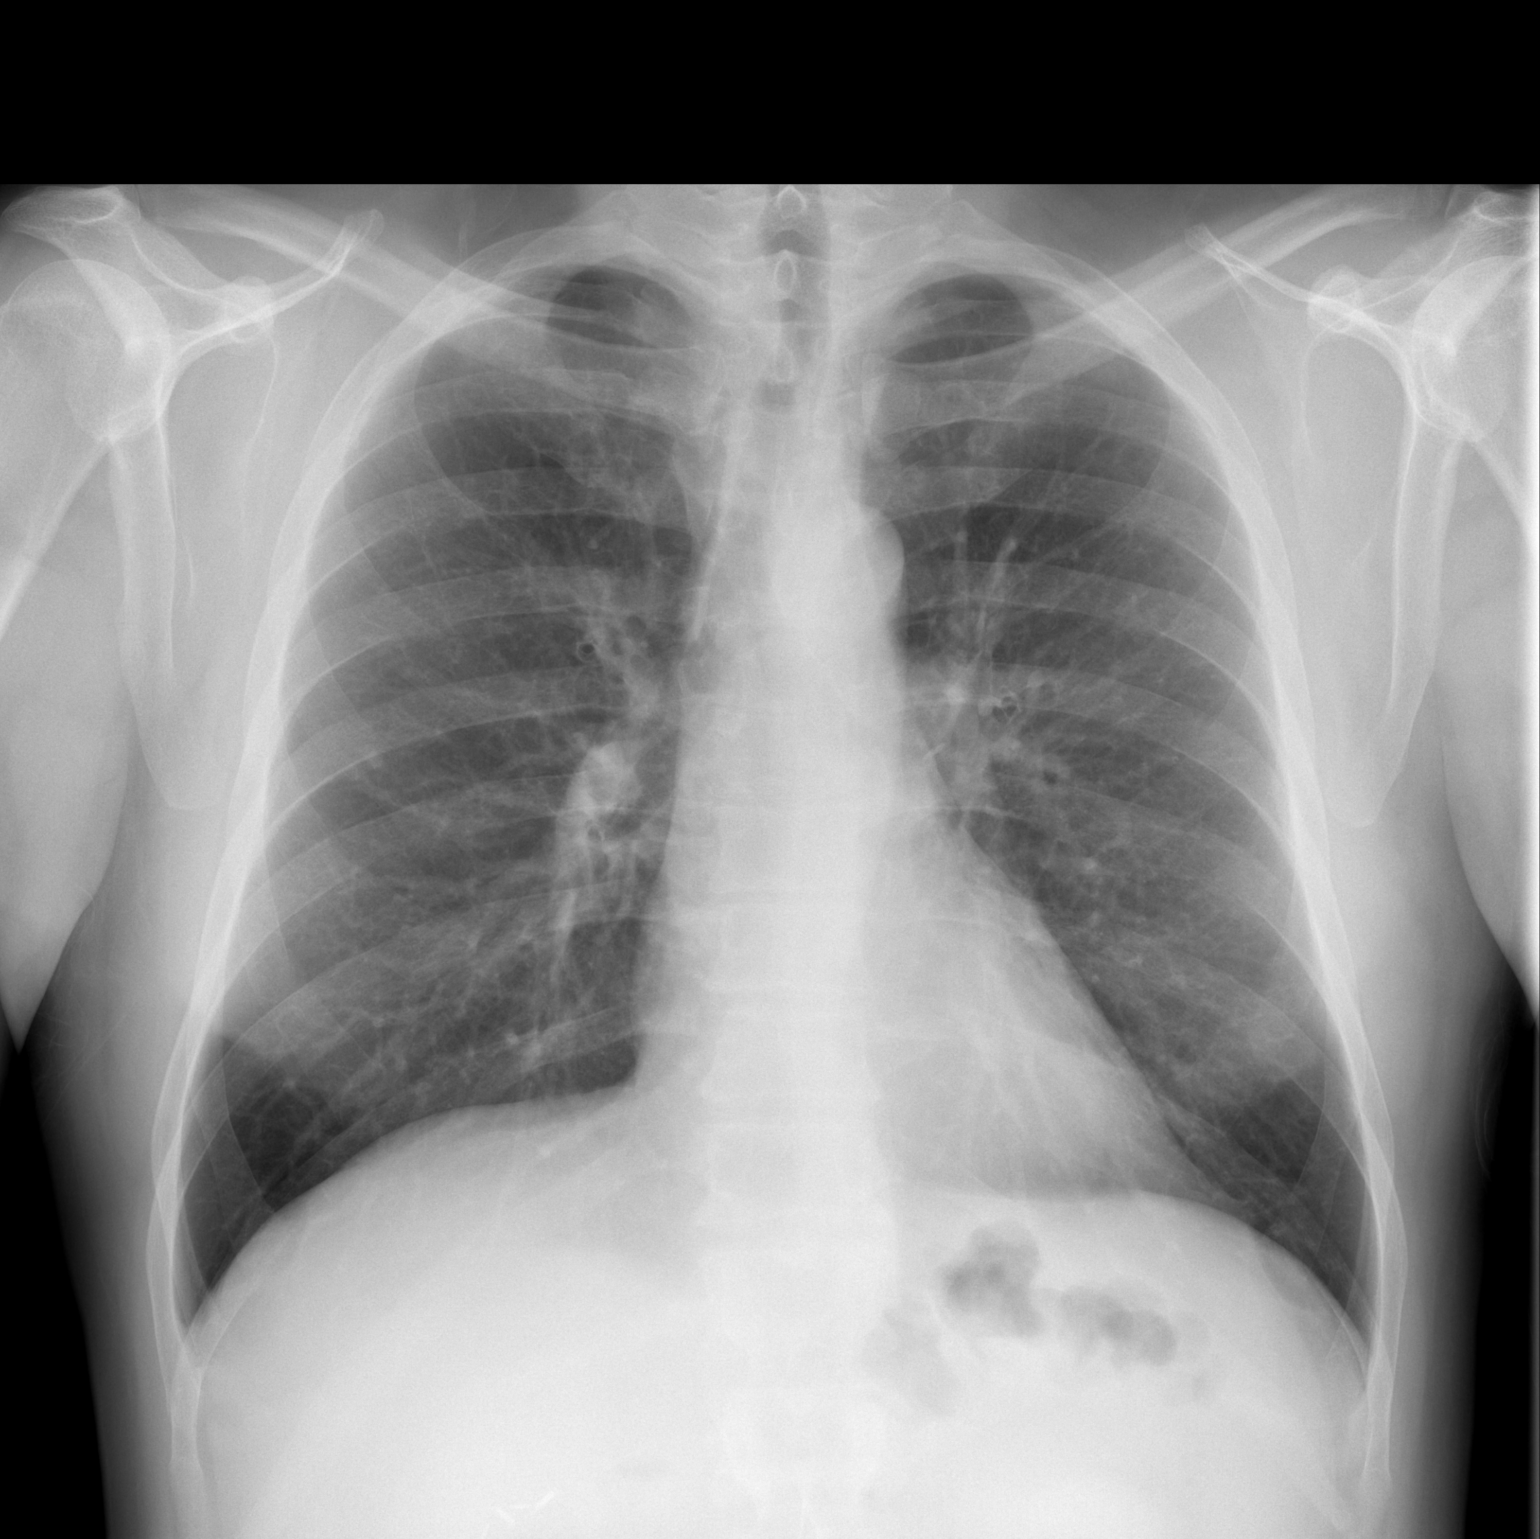

[w chest lat]
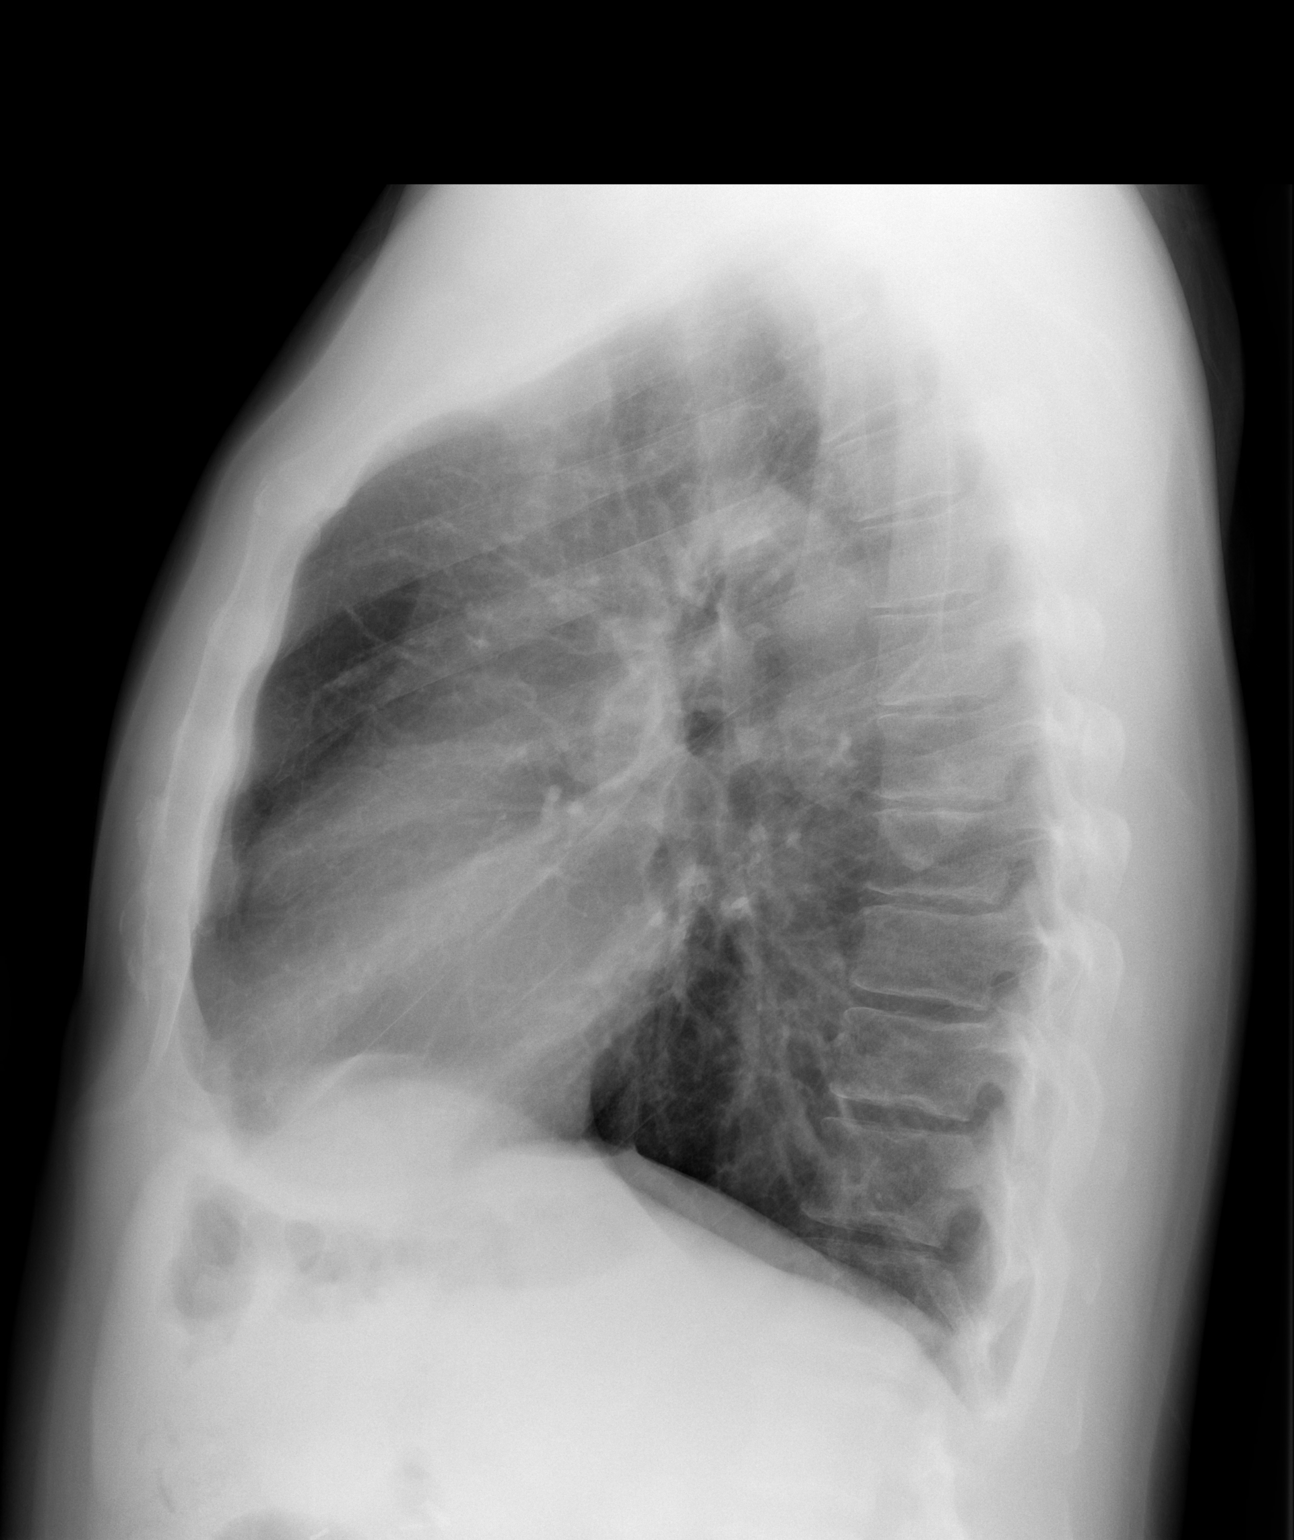

[2 of 2 positions shown; findings below may reference images not displayed]

FINDINGS: The heart size and mediastinal contours are within normal limits.
Both lungs are clear. The visualized skeletal structures are
unremarkable.
IMPRESSION: No active cardiopulmonary disease.

## 2015-07-10 IMAGING — CR DG CHEST 2V
2 series · 2 of 2 positions shown · non-contrast
Comparison: 08/08/[address] cervical spine 08/30/2012.

CLINICAL DATA: Cough, fever and congestion for a few days.

EXAM:
CHEST  2 VIEW

[w chest pa]
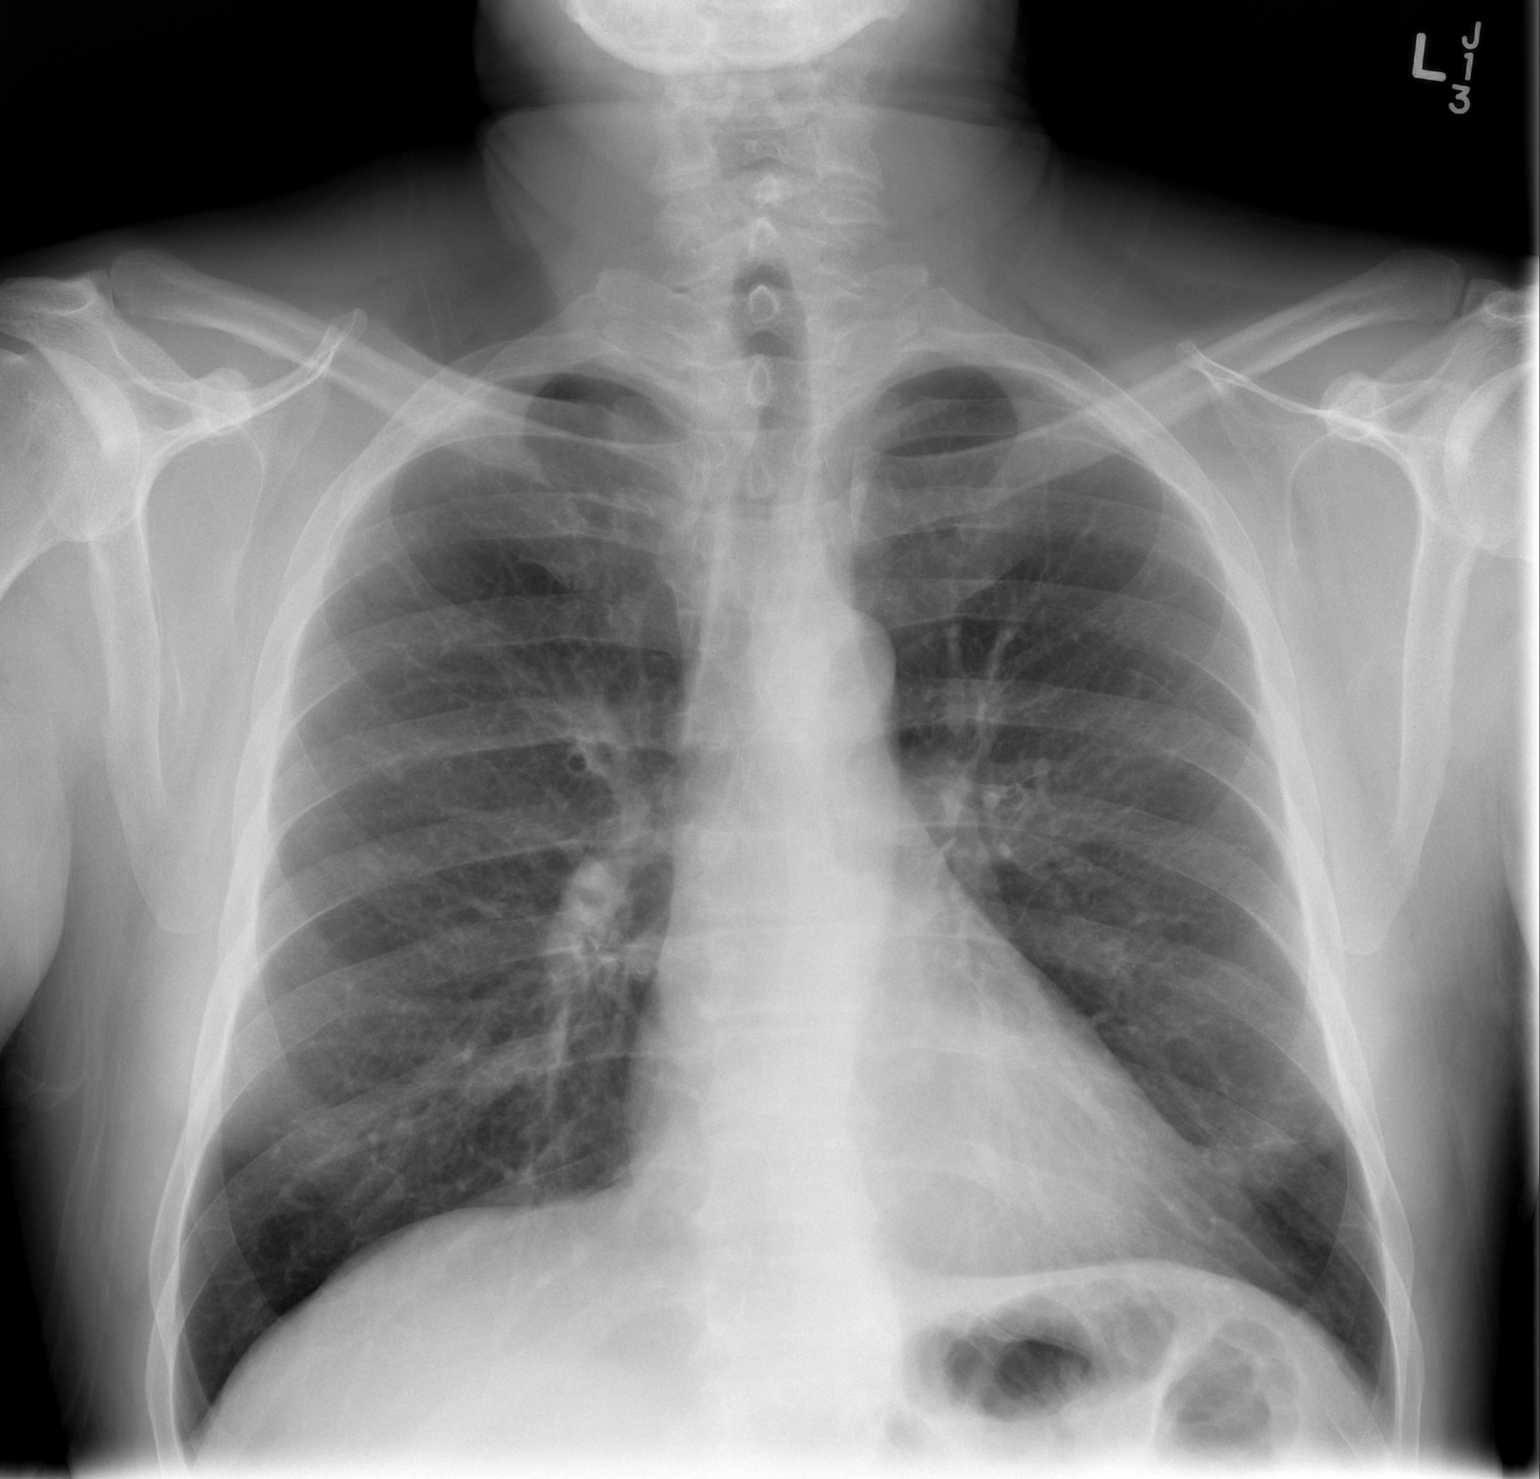

[w chest lat]
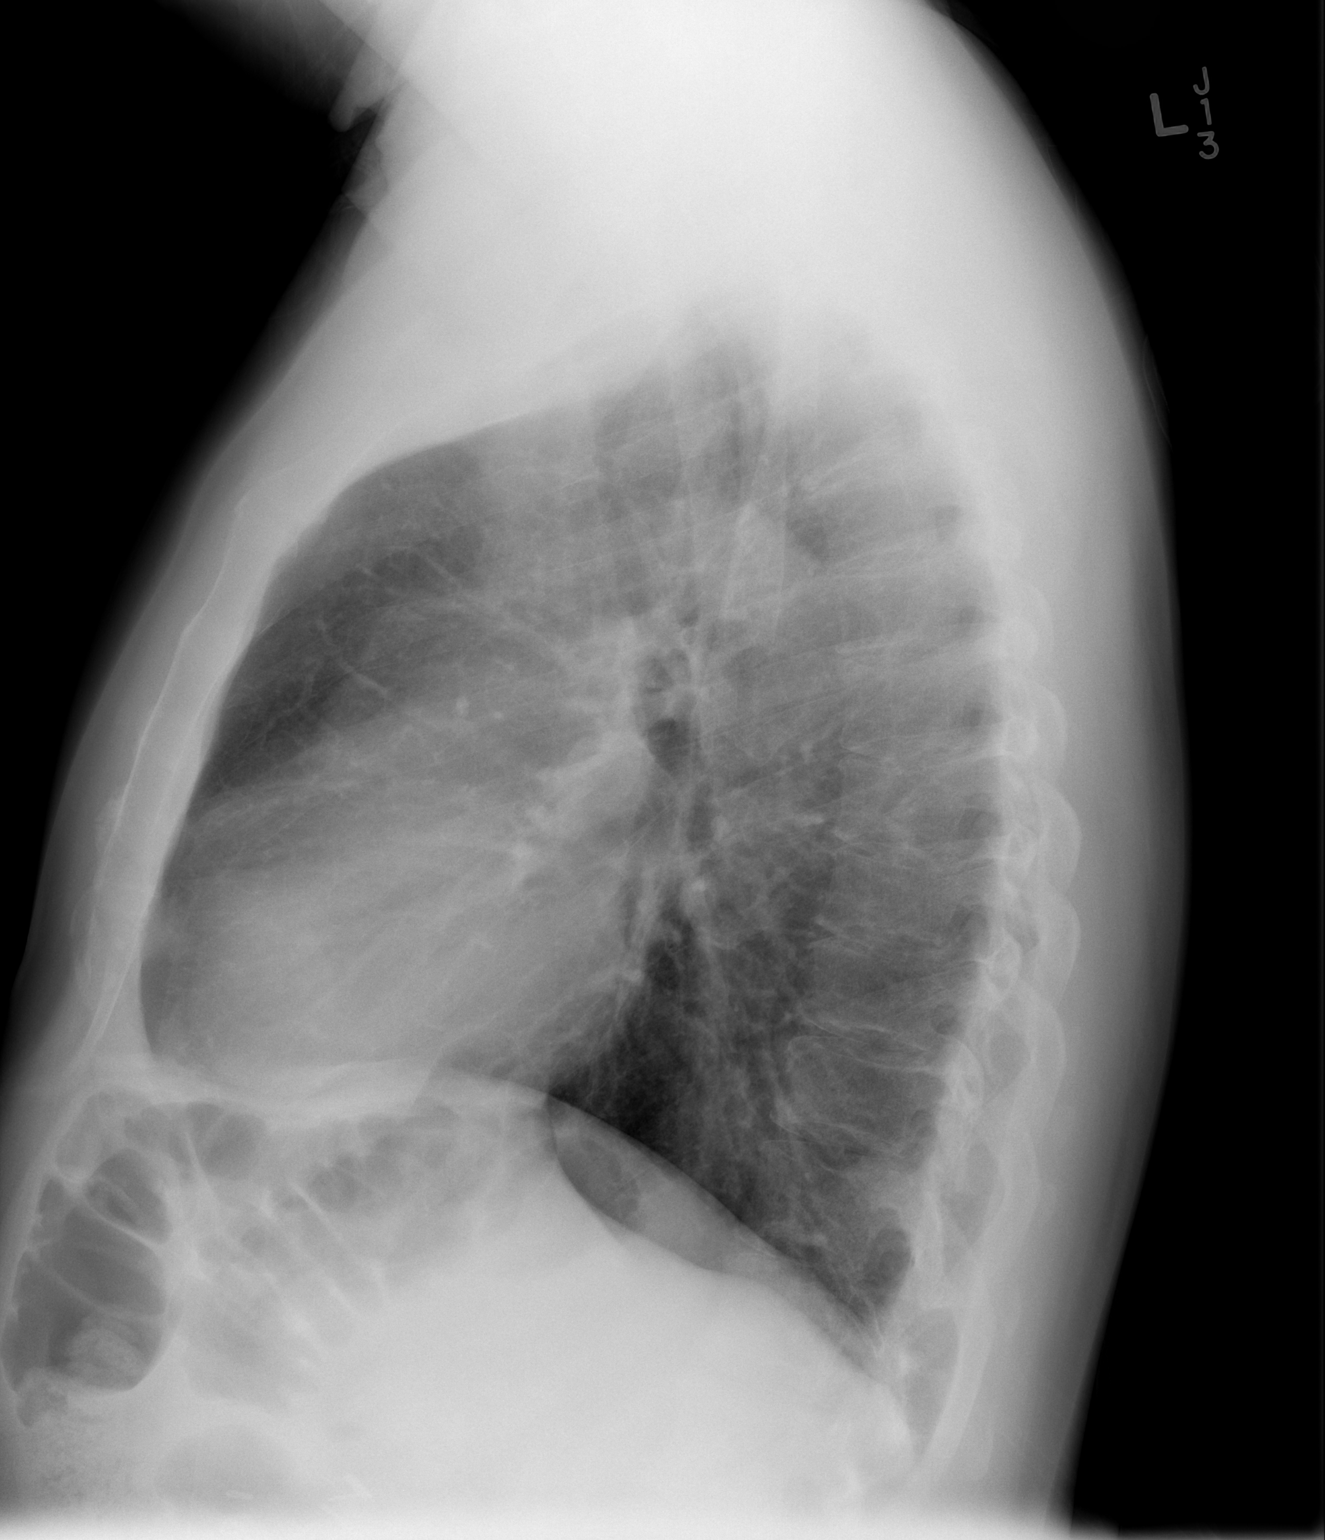

[2 of 2 positions shown; findings below may reference images not displayed]

FINDINGS: There is a slight impression on the right lateral aspect of the
trachea, unchanged. Trachea is otherwise midline. Heart size normal.
Lungs are clear. No pleural fluid.
IMPRESSION: 1. No acute findings.
2. Slight impression on the right lateral aspect of the trachea
corresponds to an enlarged right lobe of the thyroid, which contains
low-attenuation lesions on 08/30/2012. Consider nonemergent thyroid
ultrasound in further evaluation, as clinically indicated.
